# Patient Record
Sex: Male | Born: 2000 | Race: Black or African American | Hispanic: No | Marital: Single
Health system: Southern US, Community
[De-identification: ages and names within clinical notes are randomized; demographics above are authoritative.]

---
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
## 2019-05-16 ENCOUNTER — Other Ambulatory Visit: Payer: Self-pay | Admitting: *Deleted

## 2019-05-16 DIAGNOSIS — Z20822 Contact with and (suspected) exposure to covid-19: Secondary | ICD-10-CM

## 2019-05-26 NOTE — Addendum Note (Signed)
Addended by: Kaliopi Blyden M on: 05/26/2019 11:53 AM   Modules accepted: Orders  

## 2019-05-30 ENCOUNTER — Other Ambulatory Visit: Payer: Self-pay | Admitting: *Deleted

## 2019-05-30 DIAGNOSIS — Z20822 Contact with and (suspected) exposure to covid-19: Secondary | ICD-10-CM

## 2019-06-02 NOTE — Addendum Note (Signed)
Addended by: Samyria Rudie M on: 06/02/2019 03:57 PM   Modules accepted: Orders
